# Patient Record
Sex: Female | Born: 1995 | Race: White | Hispanic: No | Marital: Single | Smoking: Never smoker
Health system: Southern US, Community
[De-identification: ages and names within clinical notes are randomized; demographics above are authoritative.]

## PROBLEM LIST (undated history)

## (undated) DIAGNOSIS — T7840XA Allergy, unspecified, initial encounter: Secondary | ICD-10-CM

## (undated) DIAGNOSIS — N946 Dysmenorrhea, unspecified: Secondary | ICD-10-CM

## (undated) HISTORY — PX: TONSILLECTOMY: SUR1361

## (undated) HISTORY — DX: Allergy, unspecified, initial encounter: T78.40XA

## (undated) HISTORY — PX: KNEE SURGERY: SHX244

## (undated) HISTORY — PX: OTHER SURGICAL HISTORY: SHX169

---
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
## 2018-03-30 ENCOUNTER — Encounter: Payer: Self-pay | Admitting: Physician Assistant

## 2018-03-30 ENCOUNTER — Other Ambulatory Visit: Payer: Self-pay

## 2018-03-30 ENCOUNTER — Ambulatory Visit: Payer: BLUE CROSS/BLUE SHIELD | Admitting: Physician Assistant

## 2018-03-30 VITALS — BP 122/72 | HR 89 | Temp 98.0°F | Resp 16 | Ht 66.14 in | Wt 186.0 lb

## 2018-03-30 DIAGNOSIS — L299 Pruritus, unspecified: Secondary | ICD-10-CM | POA: Diagnosis not present

## 2018-03-30 DIAGNOSIS — L255 Unspecified contact dermatitis due to plants, except food: Secondary | ICD-10-CM

## 2018-03-30 MED ORDER — HYDROXYZINE HCL 25 MG PO TABS: 25.0000 mg | ORAL_TABLET | Freq: Four times a day (QID) | ORAL | 0 refills | Status: AC | PRN

## 2018-03-30 MED ORDER — PREDNISONE 20 MG PO TABS: ORAL_TABLET | ORAL | 0 refills | Status: AC

## 2018-03-30 MED ORDER — METHYLPREDNISOLONE ACETATE 80 MG/ML IJ SUSP
80.0000 mg | Freq: Once | INTRAMUSCULAR | Status: AC
Start: 2018-03-30 — End: 2018-03-30
  Administered 2018-03-30: 80 mg via INTRAMUSCULAR

## 2018-03-30 NOTE — Patient Instructions (Addendum)
Tomorrow, start taking Prednisone 60 mg/day the first week, 40 mg/day the second week, 20 mg/day the third week would be one possible taper for a 21-day course Come back if your symptoms worsen.    Poison Ivy Dermatitis Poison ivy dermatitis is inflammation of the skin that is caused by the allergens on the leaves of the poison ivy plant. The skin reaction often involves redness, swelling, blisters, and extreme itching. What are the causes? This condition is caused by a specific chemical (urushiol) found in the sap of the poison ivy plant. This chemical is sticky and can be easily spread to people, animals, and objects. You can get poison ivy dermatitis by:  Having direct contact with a poison ivy plant.  Touching animals, other people, or objects that have come in contact with poison ivy and have the chemical on them.  What increases the risk? This condition is more likely to develop in:  People who are outdoors often.  People who go outdoors without wearing protective clothing, such as closed shoes, long pants, and a long-sleeved shirt.  What are the signs or symptoms? Symptoms of this condition include:  Redness and itching.  A rash that often includes bumps and blisters. The rash usually appears 48 hours after exposure.  Swelling. This may occur if the reaction is more severe.  Symptoms usually last for 1-2 weeks. However, the first time you develop this condition, symptoms may last 3-4 weeks. How is this diagnosed? This condition may be diagnosed based on your symptoms and a physical exam. Your health care provider may also ask you about any recent outdoor activity. How is this treated? Treatment for this condition will vary depending on how severe it is. Treatment may include:  Hydrocortisone creams or calamine lotions to relieve itching.  Oatmeal baths to soothe the skin.  Over-the-counter antihistamine tablets.  Oral steroid medicine for more severe  outbreaks.  Follow these instructions at home:  Take or apply over-the-counter and prescription medicines only as told by your health care provider.  Wash exposed skin as soon as possible with soap and cold water.  Use hydrocortisone creams or calamine lotion as needed to soothe the skin and relieve itching.  Take oatmeal baths as needed. Use colloidal oatmeal. You can get this at your local pharmacy or grocery store. Follow the instructions on the packaging.  Do not scratch or rub your skin.  While you have the rash, wash clothes right after you wear them. How is this prevented?  Learn to identify the poison ivy plant and avoid contact with the plant. This plant can be recognized by the number of leaves. Generally, poison ivy has three leaves with flowering branches on a single stem. The leaves are typically glossy, and they have jagged edges that come to a point at the front.  If you have been exposed to poison ivy, thoroughly wash with soap and water right away. You have about 30 minutes to remove the plant resin before it will cause the rash. Be sure to wash under your fingernails because any plant resin there will continue to spread the rash.  When hiking or camping, wear clothes that will help you to avoid exposure on the skin. This includes long pants, a long-sleeved shirt, tall socks, and hiking boots. You can also apply preventive lotion to your skin to help limit exposure.  If you suspect that your clothes or outdoor gear came in contact with poison ivy, rinse them off outside with a garden  hose before you bring them inside your house. Contact a health care provider if:  You have open sores in the rash area.  You have more redness, swelling, or pain in the affected area.  You have redness that spreads beyond the rash area.  You have fluid, blood, or pus coming from the affected area.  You have a fever.  You have a rash over a large area of your body.  You have a rash  on your eyes, mouth, or genitals.  Your rash does not improve after a few days. Get help right away if:  Your face swells or your eyes swell shut.  You have trouble breathing.  You have trouble swallowing. This information is not intended to replace advice given to you by your health care provider. Make sure you discuss any questions you have with your health care provider. Document Released: 11/14/2000 Document Revised: 04/24/2016 Document Reviewed: 04/25/2015 Elsevier Interactive Patient Education  [employer].   Thank you for coming in today. I hope you feel we met your needs.  Feel free to call PCP if you have any questions or further requests.  Please consider signing up for MyChart if you do not already have it, as this is a great way to communicate with me.  Best,  Whitney McVey, PA-C  IF you received an x-ray today, you will receive an invoice from [facility]. Please contact [facility] at [phone] with questions or concerns regarding your invoice.   IF you received labwork today, you will receive an invoice from [facility]. Please contact [facility] at [phone] with questions or concerns regarding your invoice.   Our billing staff will not be able to assist you with questions regarding bills from these companies.  You will be contacted with the lab results as soon as they are available. The fastest way to get your results is to activate your My Chart account. Instructions are located on the last page of this paperwork. If you have not heard from [place] regarding the results in 2 weeks, please contact this office.

## 2018-03-30 NOTE — Progress Notes (Signed)
   Janet Lindsey  MRN: [id number] DOB: [birth year]  PCP: Maryellen Pile, MD  Subjective:  Pt is a 22 year old female who presents to clinic for rash x 1.5 weeks. She was clearing her yard > 1 week ago. The next day or two developed a rash on her right wrist and face. She has used topical calamine lotion and poison ivy wash, which are not helping.  The rash on her face is worsening. C/o itching.  Denies shob, wheezing, difficulty breathing, cough, abdominal pain.    Review of Systems  Constitutional: Negative for diaphoresis and fatigue.  Respiratory: Negative for cough, chest tightness, shortness of breath and wheezing.   Gastrointestinal: Negative for abdominal pain, nausea and vomiting.  Skin: Positive for rash.    There are no active problems to display for this patient.   No current outpatient medications on file prior to visit.   No current facility-administered medications on file prior to visit.     No Known Allergies   Objective:  BP 122/72   Pulse 89   Temp 98 F (36.7 C) (Oral)   Resp 16   Ht 5' 6.14" (1.68 m)   Wt 186 lb (84.4 kg)   SpO2 99%   BMI 29.89 kg/m   Physical Exam  Constitutional: She is oriented to person, place, and time. No distress.  Pulmonary/Chest: Effort normal. She has no wheezes.  Neurological: She is alert and oriented to person, place, and time.  Skin: Skin is warm and dry. Rash noted. Rash is maculopapular.     Erythematous maculopapular lesions about face. Moderate swelling of left jaw line. No involvement of oral mucosa or airway.   Psychiatric: Judgment normal.  Vitals reviewed.   Assessment and Plan :  1. Plant dermatitis - methylPREDNISolone acetate (DEPO-MEDROL) injection 80 mg - predniSONE (DELTASONE) 20 MG tablet; 60 mg/day the first week; 40 mg/day the second week; 20 mg/day the third week.  Dispense: 42 tablet; Refill: 0 - Pt presents with worsening poison ivy. No concern today for involvement of airway. Plan to  provide IM depo-medrol as facial involvement is worsening. Discussed prednisone taper x 3 weeks. RTC if no improvement.  2. Itching - hydrOXYzine (ATARAX/VISTARIL) 25 MG tablet; Take 1 tablet (25 mg total) by mouth every 6 (six) hours as needed for itching.  Dispense: 30 tablet; Refill: 0   Marco Collie, PA-C  [facility] 03/30/2018 3:39 PM
